# Patient Record
Sex: Female | Born: 1955
Health system: Southern US, Community
[De-identification: ages and names within clinical notes are randomized; demographics above are authoritative.]

## PROBLEM LIST (undated history)

## (undated) DIAGNOSIS — J45909 Unspecified asthma, uncomplicated: Secondary | ICD-10-CM

## (undated) HISTORY — PX: ABDOMINAL HYSTERECTOMY: SHX81

---

## 2015-07-13 ENCOUNTER — Ambulatory Visit
Admission: EM | Admit: 2015-07-13 | Discharge: 2015-07-13 | Disposition: A | Discharge status: Home / Self Care | Payer: BLUE CROSS/BLUE SHIELD | Attending: Family Medicine | Admitting: Family Medicine

## 2015-07-13 ENCOUNTER — Ambulatory Visit (INDEPENDENT_AMBULATORY_CARE_PROVIDER_SITE_OTHER): Payer: BLUE CROSS/BLUE SHIELD

## 2015-07-13 DIAGNOSIS — J45901 Unspecified asthma with (acute) exacerbation: Secondary | ICD-10-CM | POA: Diagnosis not present

## 2015-07-13 DIAGNOSIS — R0602 Shortness of breath: Secondary | ICD-10-CM | POA: Diagnosis not present

## 2015-07-13 HISTORY — DX: Unspecified asthma, uncomplicated: J45.909

## 2015-07-13 MED ORDER — HYDROCOD POLST-CPM POLST ER 10-8 MG/5ML PO SUER: 5.0000 mL | Freq: Two times a day (BID) | ORAL | Status: DC | PRN

## 2015-07-13 MED ORDER — FLUTICASONE-SALMETEROL 250-50 MCG/DOSE IN AEPB: 1.0000 | INHALATION_SPRAY | Freq: Two times a day (BID) | RESPIRATORY_TRACT | Status: DC

## 2015-07-13 MED ORDER — METHYLPREDNISOLONE SODIUM SUCC 125 MG IJ SOLR
125.0000 mg | Freq: Once | INTRAMUSCULAR | Status: AC
  Administered 2015-07-13: 125 mg via INTRAMUSCULAR

## 2015-07-13 MED ORDER — PREDNISONE 10 MG (21) PO TBPK: ORAL_TABLET | ORAL | Status: DC

## 2015-07-13 MED ORDER — ALBUTEROL SULFATE 108 (90 BASE) MCG/ACT IN AEPB: 2.0000 | INHALATION_SPRAY | RESPIRATORY_TRACT | Status: AC | PRN

## 2015-07-13 MED ORDER — IPRATROPIUM-ALBUTEROL 0.5-2.5 (3) MG/3ML IN SOLN
3.0000 mL | Freq: Once | RESPIRATORY_TRACT | Status: AC
  Administered 2015-07-13: 3 mL via RESPIRATORY_TRACT

## 2015-07-13 MED ORDER — AZITHROMYCIN 500 MG PO TABS: ORAL_TABLET | ORAL | Status: DC

## 2015-07-13 NOTE — ED Notes (Addendum)
States started last Thursday with cough that progressed to sinus congestion and now "moved back into chest and I''m worried about being SOB". States had CXR at a [facility] facility about 2-3 months ago

## 2015-07-13 NOTE — Discharge Instructions (Signed)
Asthma, Acute Bronchospasm °Acute bronchospasm caused by asthma is also referred to as an asthma attack. Bronchospasm means your air passages become narrowed. The narrowing is caused by inflammation and tightening of the muscles in the air tubes (bronchi) in your lungs. This can make it hard to breathe or cause you to wheeze and cough. °CAUSES °Possible triggers are: °· Animal dander from the skin, hair, or feathers of animals. °· Dust mites contained in house dust. °· Cockroaches. °· Pollen from trees or grass. °· Mold. °· Cigarette or tobacco smoke. °· Air pollutants such as dust, household cleaners, hair sprays, aerosol sprays, paint fumes, strong chemicals, or strong odors. °· Cold air or weather changes. Cold air may trigger inflammation. Winds increase molds and pollens in the air. °· Strong emotions such as crying or laughing hard. °· Stress. °· Certain medicines such as aspirin or beta-blockers. °· Sulfites in foods and drinks, such as dried fruits and wine. °· Infections or inflammatory conditions, such as a flu, cold, or inflammation of the nasal membranes (rhinitis). °· Gastroesophageal reflux disease (GERD). GERD is a condition where stomach acid backs up into your esophagus. °· Exercise or strenuous activity. °SIGNS AND SYMPTOMS  °· Wheezing. °· Excessive coughing, particularly at night. °· Chest tightness. °· Shortness of breath. °DIAGNOSIS  °Your health care provider will ask you about your medical history and perform a physical exam. A chest X-ray or blood testing may be performed to look for other causes of your symptoms or other conditions that may have triggered your asthma attack.  °TREATMENT  °Treatment is aimed at reducing inflammation and opening up the airways in your lungs.  Most asthma attacks are treated with inhaled medicines. These include quick relief or rescue medicines (such as bronchodilators) and controller medicines (such as inhaled corticosteroids). These medicines are sometimes  given through an inhaler or a nebulizer. Systemic steroid medicine taken by mouth or given through an IV tube also can be used to reduce the inflammation when an attack is moderate or severe. Antibiotic medicines are only used if a bacterial infection is present.  °HOME CARE INSTRUCTIONS  °· Rest. °· Drink plenty of liquids. This helps the mucus to remain thin and be easily coughed up. Only use caffeine in moderation and do not use alcohol until you have recovered from your illness. °· Do not smoke. Avoid being exposed to secondhand smoke. °· You play a critical role in keeping yourself in good health. Avoid exposure to things that cause you to wheeze or to have breathing problems. °· Keep your medicines up-to-date and available. Carefully follow your health care provider's treatment plan. °· Take your medicine exactly as prescribed. °· When pollen or pollution is bad, keep windows closed and use an air conditioner or go to places with air conditioning. °· Asthma requires careful medical care. See your health care provider for a follow-up as advised. If you are more than [redacted] weeks pregnant and you were prescribed any new medicines, let your obstetrician know about the visit and how you are doing. Follow up with your health care provider as directed. °· After you have recovered from your asthma attack, make an appointment with your outpatient doctor to talk about ways to reduce the likelihood of future attacks. If you do not have a doctor who manages your asthma, make an appointment with a primary care doctor to discuss your asthma. °SEEK IMMEDIATE MEDICAL CARE IF:  °· You are getting worse. °· You have trouble breathing. If severe, call your local   emergency services (911 in the [place]  You develop chest pain or discomfort.  You are vomiting.  You are not able to keep fluids down.  You are coughing up yellow, green, brown, or bloody sputum.  You have a fever and your symptoms suddenly get worse.  You have  trouble swallowing. MAKE SURE YOU:   Understand these instructions.  Will watch your condition.  Will get help right away if you are not doing well or get worse.   This information is not intended to replace advice given to you by your health care provider. Make sure you discuss any questions you have with your health care provider.   Document Released: 09/26/2006 Document Revised: 06/16/2013 Document Reviewed: 12/17/2012 Elsevier Interactive Patient Education 2016 Elsevier Inc.  Cough, Adult A cough helps to clear your throat and lungs. A cough may last only 2-3 weeks (acute), or it may last longer than 8 weeks (chronic). Many different things can cause a cough. A cough may be a sign of an illness or another medical condition. HOME CARE  Pay attention to any changes in your cough.  Take medicines only as told by your doctor.  If you were prescribed an antibiotic medicine, take it as told by your doctor. Do not stop taking it even if you start to feel better.  Talk with your doctor before you try using a cough medicine.  Drink enough fluid to keep your pee (urine) clear or pale yellow.  If the air is dry, use a cold steam vaporizer or humidifier in your home.  Stay away from things that make you cough at work or at home.  If your cough is worse at night, try using extra pillows to raise your head up higher while you sleep.  Do not smoke, and try not to be around smoke. If you need help quitting, ask your doctor.  Do not have caffeine.  Do not drink alcohol.  Rest as needed. GET HELP IF:  You have new problems (symptoms).  You cough up yellow fluid (pus).  Your cough does not get better after 2-3 weeks, or your cough gets worse.  Medicine does not help your cough and you are not sleeping well.  You have pain that gets worse or pain that is not helped with medicine.  You have a fever.  You are losing weight and you do not know why.  You have night sweats. GET  HELP RIGHT AWAY IF:  You cough up blood.  You have trouble breathing.  Your heartbeat is very fast.   This information is not intended to replace advice given to you by your health care provider. Make sure you discuss any questions you have with your health care provider.   Document Released: 02/22/2011 Document Revised: 03/02/2015 Document Reviewed: 08/18/2014 Elsevier Interactive Patient Education 2016 [employer].  How to Use an Inhaler Using your inhaler correctly is very important. Good technique will make sure that the medicine reaches your lungs.  HOW TO USE AN INHALER:  Take the cap off the inhaler.  If this is the first time using your inhaler, you need to prime it. Shake the inhaler for 5 seconds. Release four puffs into the air, away from your face. Ask your doctor for help if you have questions.  Shake the inhaler for 5 seconds.  Turn the inhaler so the bottle is above the mouthpiece.  Put your pointer finger on top of the bottle. Your thumb holds the bottom of the inhaler.  Open your mouth.  Either hold the inhaler away from your mouth (the width of 2 fingers) or place your lips tightly around the mouthpiece. Ask your doctor which way to use your inhaler.  Breathe out as much air as possible.  Breathe in and push down on the bottle 1 time to release the medicine. You will feel the medicine go in your mouth and throat.  Continue to take a deep breath in very slowly. Try to fill your lungs.  After you have breathed in completely, hold your breath for 10 seconds. This will help the medicine to settle in your lungs. If you cannot hold your breath for 10 seconds, hold it for as long as you can before you breathe out.  Breathe out slowly, through pursed lips. Whistling is an example of pursed lips.  If your doctor has told you to take more than 1 puff, wait at least 15-30 seconds between puffs. This will help you get the best results from your medicine. Do not use  the inhaler more than your doctor tells you to.  Put the cap back on the inhaler.  Follow the directions from your doctor or from the inhaler package about cleaning the inhaler. If you use more than one inhaler, ask your doctor which inhalers to use and what order to use them in. Ask your doctor to help you figure out when you will need to refill your inhaler.  If you use a steroid inhaler, always rinse your mouth with water after your last puff, gargle and spit out the water. Do not swallow the water. GET HELP IF:  The inhaler medicine only partially helps to stop wheezing or shortness of breath.  You are having trouble using your inhaler.  You have some increase in thick spit (phlegm). GET HELP RIGHT AWAY IF:  The inhaler medicine does not help your wheezing or shortness of breath or you have tightness in your chest.  You have dizziness, headaches, or fast heart rate.  You have chills, fever, or night sweats.  You have a large increase of thick spit, or your thick spit is bloody. MAKE SURE YOU:   Understand these instructions.  Will watch your condition.  Will get help right away if you are not doing well or get worse.   This information is not intended to replace advice given to you by your health care provider. Make sure you discuss any questions you have with your health care provider.   Document Released: 03/20/2008 Document Revised: 04/01/2013 Document Reviewed: 01/08/2013 Elsevier Interactive Patient Education 2016 [employer].  Shortness of Breath Shortness of breath means you have trouble breathing. Shortness of breath needs medical care right away. HOME CARE   Do not smoke.  Avoid being around chemicals or things (paint fumes, dust) that may bother your breathing.  Rest as needed. Slowly begin your normal activities.  Only take medicines as told by your doctor.  Keep all doctor visits as told. GET HELP RIGHT AWAY IF:   Your shortness of breath gets  worse.  You feel lightheaded, pass out (faint), or have a cough that is not helped by medicine.  You cough up blood.  You have pain with breathing.  You have pain in your chest, arms, shoulders, or belly (abdomen).  You have a fever.  You cannot walk up stairs or exercise the way you normally do.  You do not get better in the time expected.  You have a hard time doing normal activities  even with rest.  You have problems with your medicines.  You have any new symptoms. MAKE SURE YOU:  Understand these instructions.  Will watch your condition.  Will get help right away if you are not doing well or get worse.   This information is not intended to replace advice given to you by your health care provider. Make sure you discuss any questions you have with your health care provider.   Document Released: 11/28/2007 Document Revised: 06/16/2013 Document Reviewed: 08/27/2011 Elsevier Interactive Patient Education [employer]

## 2015-07-13 NOTE — ED Provider Notes (Signed)
CSN: [id number]     Arrival date & time 07/13/15  2130 History   First MD Initiated Contact with Patient 07/13/15 [phone]    Nurses notes were reviewed. Chief Complaint  Patient presents with  . Cough  . Shortness of Breath   She is here because of shortness of breath. She was diagnosed last year with adult onset asthma is also correlated last year with her stopping smoking in May 2015. She reports she was given a steroid inhaler which didn't do much for her. About 4 days ago she started coughing initially productive but increased shortness of breath as well. This is the first time she has really had a bad experience and she states that she can take just a few steps and she starts skiing short of breath with exertion soft. She reports coughing wheezing and difficulty sleeping at night as well because the cough.  He has a uncle on mother's side died of lung cancer mother is very healthy and father died of heart disease. States she stopped smoking 2015 she denies any other medical problems.   (Consider location/radiation/quality/duration/timing/severity/associated sxs/prior Treatment) Patient is a 60 y.o. female presenting with shortness of breath. The history is provided by the patient. No language interpreter was used.  Shortness of Breath Severity:  Severe Duration:  4 days Timing:  Constant Progression:  Worsening Chronicity:  New Context: URI and weather changes   Relieved by:  Nothing Ineffective treatments:  Inhaler Associated symptoms: cough   Associated symptoms: no chest pain   Risk factors: tobacco use     Past Medical History  Diagnosis Date  . Asthma    Past Surgical History  Procedure Laterality Date  . Abdominal hysterectomy     History reviewed. No pertinent family history. Social History  Substance Use Topics  . Smoking status: Former Games developer  . Smokeless tobacco: None  . Alcohol Use: No   OB History    No data available     Review of Systems  Respiratory:  Positive for cough and shortness of breath.   Cardiovascular: Negative for chest pain.  All other systems reviewed and are negative.   Allergies  Review of patient's allergies indicates no known allergies.  Home Medications   Prior to Admission medications   Medication Sig Start Date End Date Taking? Authorizing Provider  fluticasone (FLOVENT HFA) 110 MCG/ACT inhaler Inhale into the lungs 2 (two) times daily.   Yes Historical Provider, MD  Albuterol Sulfate (PROAIR RESPICLICK) 108 (90 Base) MCG/ACT AEPB Inhale 2 puffs into the lungs as needed (Q2 hours when necessary for shortness of breath). 07/13/15   Hassan Rowan, MD  azithromycin (ZITHROMAX) 500 MG tablet 1 tablet day for 5 days 07/13/15   Hassan Rowan, MD  chlorpheniramine-HYDROcodone [facility]) 10-8 MG/5ML SUER Take 5 mLs by mouth every 12 (twelve) hours as needed for cough. 07/13/15   Hassan Rowan, MD  Fluticasone-Salmeterol (ADVAIR DISKUS) 250-50 MCG/DOSE AEPB Inhale 1 puff into the lungs 2 (two) times daily. 07/13/15   Hassan Rowan, MD  predniSONE (STERAPRED UNI-PAK 21 TAB) 10 MG (21) TBPK tablet Sig 6 tablet day 1, 5 tablets day 2, 4 tablets day 3,,3tablets day 4, 2 tablets day 5, 1 tablet day 6 take all tablets orally 07/13/15   Hassan Rowan, MD   Meds Ordered and Administered this Visit   Medications  methylPREDNISolone sodium succinate (SOLU-MEDROL) 125 mg/2 mL injection 125 mg (125 mg Intramuscular Given 07/13/15 0953)  ipratropium-albuterol (DUONEB) 0.5-2.5 (3) MG/3ML nebulizer solution  3 mL (3 mLs Nebulization Given 07/13/15 0953)    BP 152/87 mmHg  Pulse 96  Temp(Src) 97.8 F (36.6 C) (Tympanic)  Resp 22  Ht  (1.702 m)  Wt 165 lb (74.844 kg)  BMI 25.84 kg/m2  SpO2 94% No data found.   Physical Exam  Constitutional: She is oriented to person, place, and time. She appears well-developed and well-nourished.  HENT:  Head: Normocephalic and atraumatic.  Eyes: Conjunctivae are normal. Pupils are equal,  round, and reactive to light.  Neck: Normal range of motion. Neck supple.  Cardiovascular: Normal rate, regular rhythm and normal heart sounds.   Pulmonary/Chest: Effort normal. No respiratory distress. She has wheezes. She has no rales.  Musculoskeletal: Normal range of motion. She exhibits no edema.  Lymphadenopathy:    She has no cervical adenopathy.  Neurological: She is alert and oriented to person, place, and time. No cranial nerve deficit.  Skin: Skin is warm and dry.  Psychiatric: She has a normal mood and affect.  Vitals reviewed.   ED Course  Procedures (including critical care time)  Labs Review Labs Reviewed - No data to display  Imaging Review Dg Chest 2 View  07/13/2015  CLINICAL DATA:  Six day history of shortness of breath EXAM: CHEST  2 VIEW COMPARISON:  None. FINDINGS: Lungs are mildly hyperexpanded. No edema or consolidation. Heart size and pulmonary vascularity are normal. No adenopathy. No bone lesions. IMPRESSION: Lungs mildly hyperexpanded without edema or consolidation. Electronically Signed   By: Bretta Bang III M.D.   On: 07/13/2015 09:36     Visual Acuity Review  Right Eye Distance:   Left Eye Distance:   Bilateral Distance:    Right Eye Near:   Left Eye Near:    Bilateral Near:         MDM   1. SOB (shortness of breath) on exertion   2. Acute asthma exacerbation, unspecified asthma severity    Patient was given under 25 mg sorry Medrol IM DuoNeb aerosol treatment here with improvement of her O2 saturation. Patient was in tears as discussed my concern that she has COPD. Lungs suggest you get back with her PCP. She states that no clear-cut diagnoses been made since she was placed on Flovent inhaler she never did go back to see her doctor over a year ago. Explained to her that she needs excision she wants to be followed by her current doctor or see someone else she needs to have probably a spirometry done so that they can diagnose her if she  does does not have COPD which I think she does. Explained to her that think this is acute exacerbation of COPD. With that were going to place on Tussionex 1 teaspoon twice a day Zithromax for 5 days and I'll place on Advair discus inhaler if her insurance covers it in a [facility] inhaler and 6 day course of prednisone. Chest x-ray was negative and a pulse explained patient that cannot say for sure that she does have COPD until further testing as been done.    Hassan Rowan, MD 07/13/15 1012

## 2016-12-29 IMAGING — CR DG CHEST 2V
2 series · 2 of 2 positions shown · non-contrast
Comparison: None.

CLINICAL DATA: Six day history of shortness of breath

EXAM:
CHEST  2 VIEW

[chest pa]
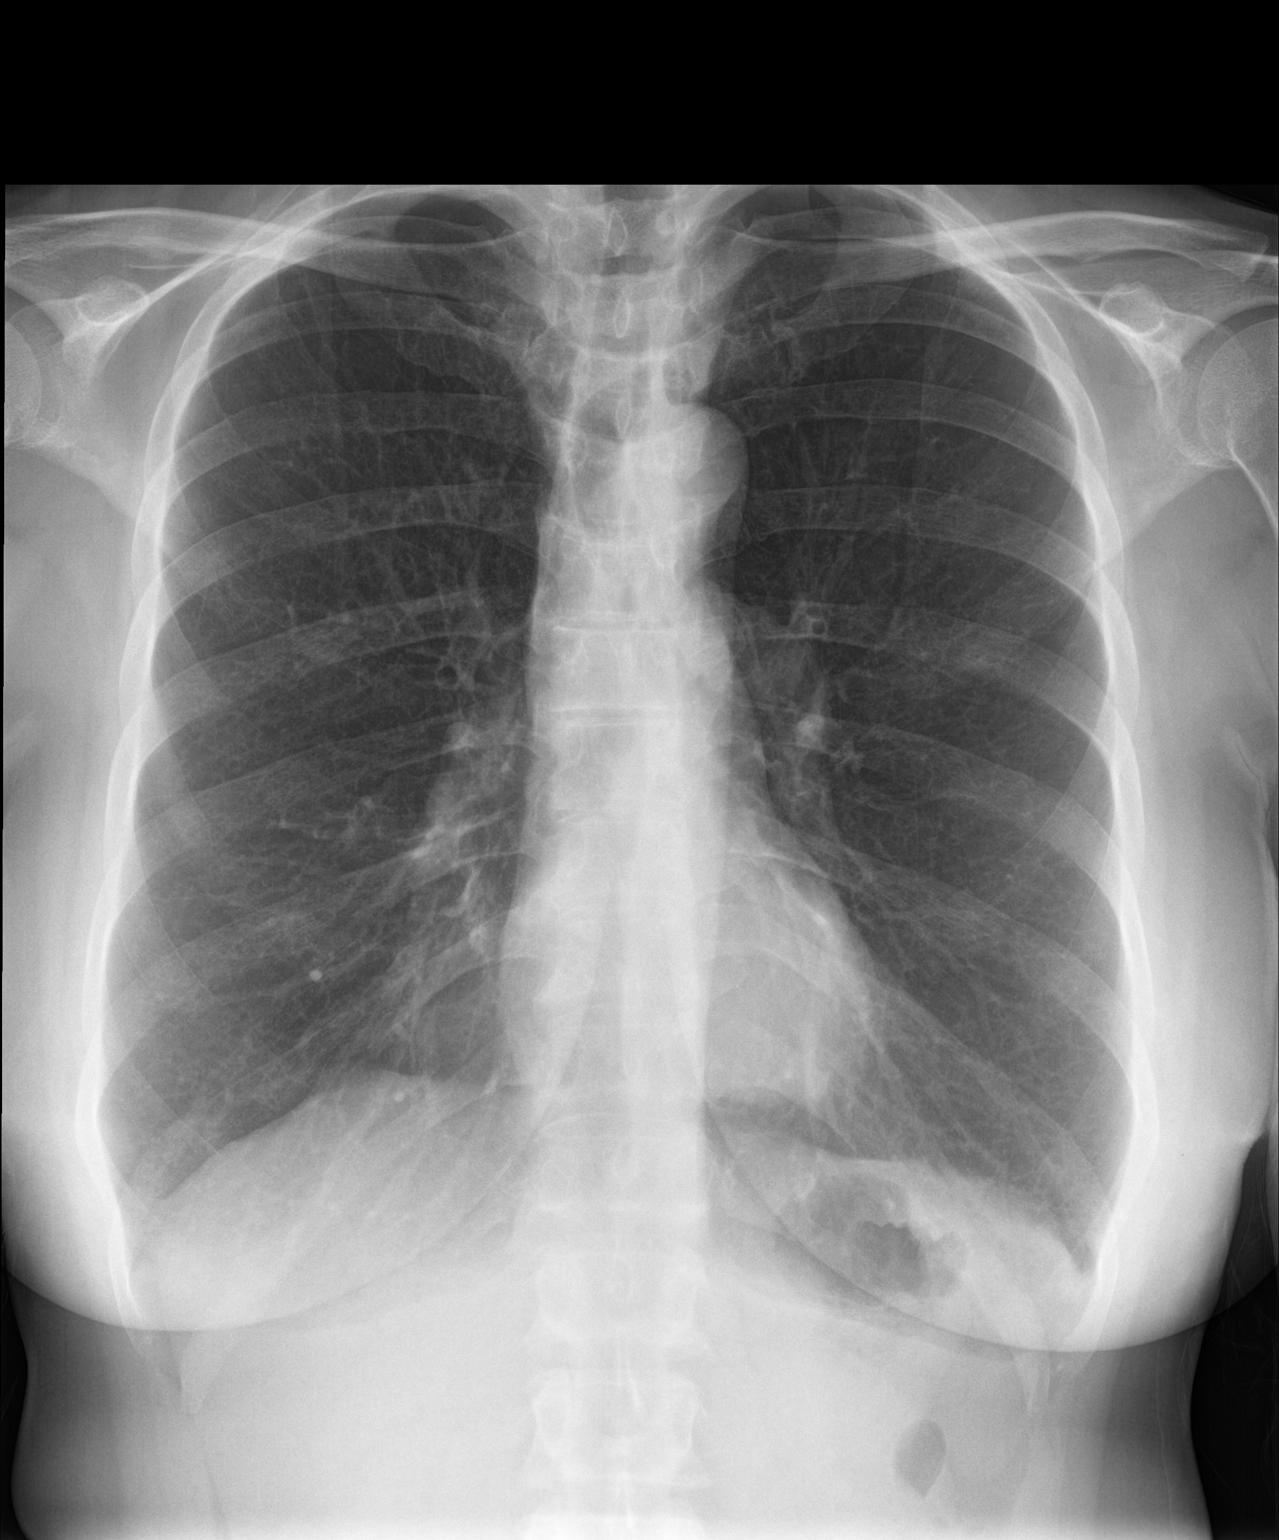

[chest lat]
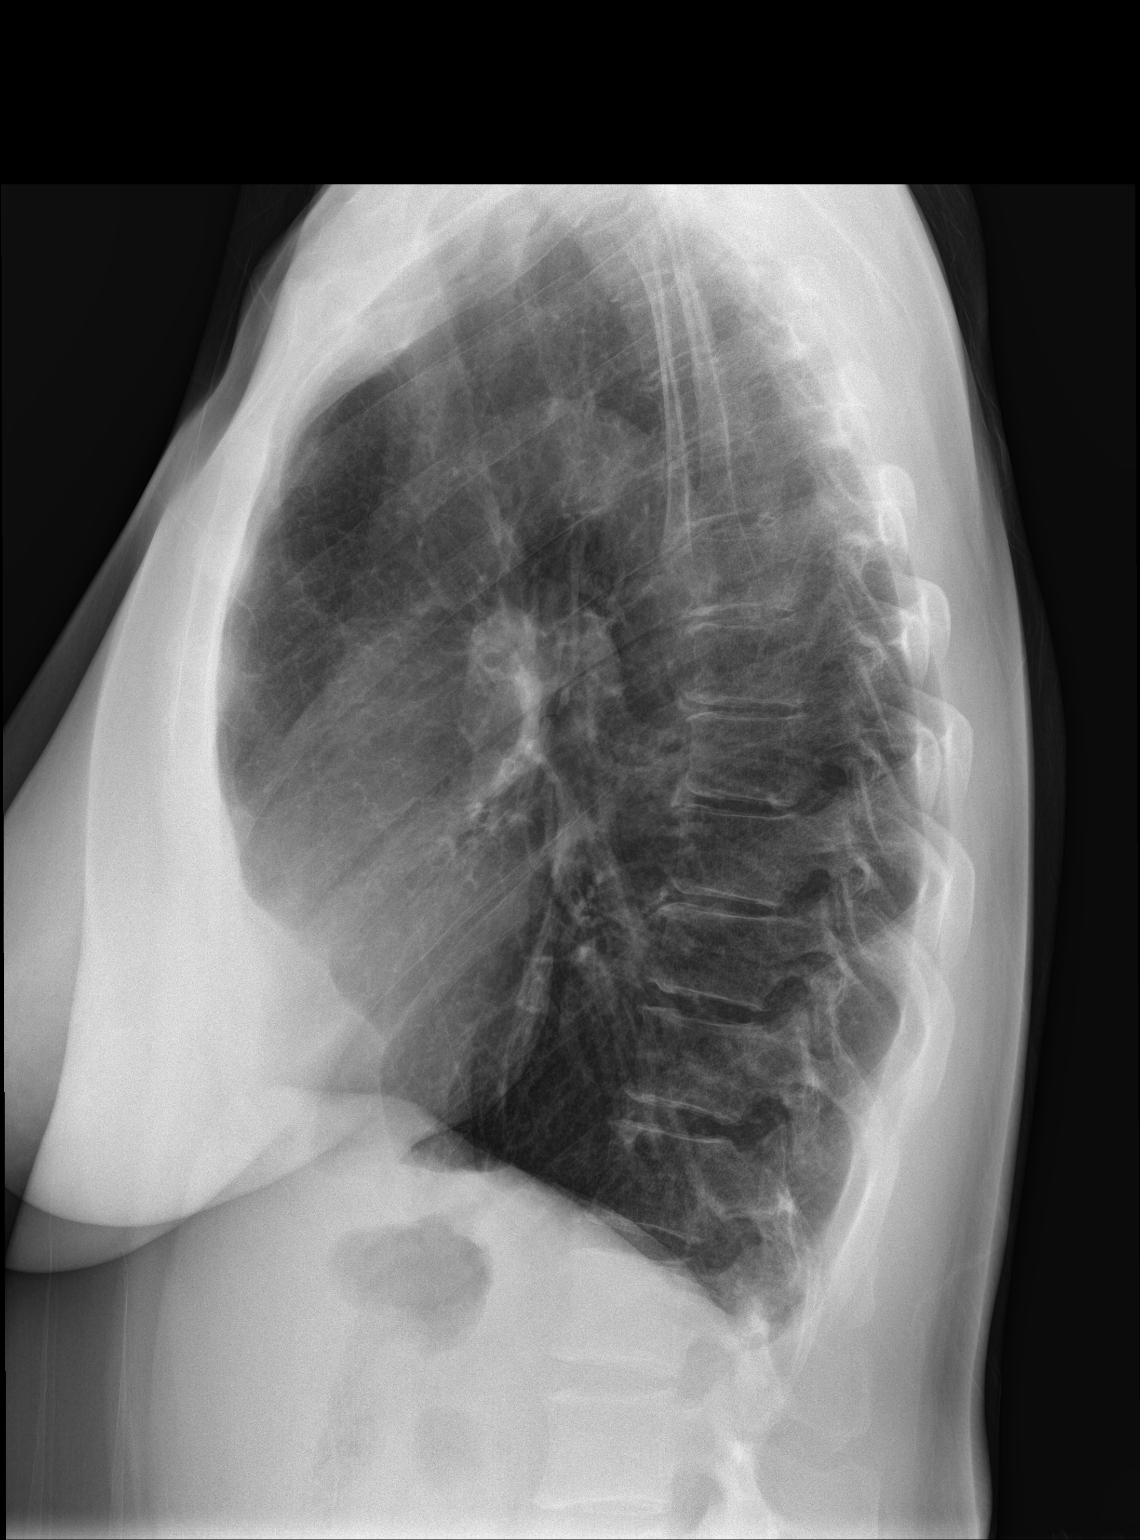

[2 of 2 positions shown; findings below may reference images not displayed]

FINDINGS: Lungs are mildly hyperexpanded. No edema or consolidation. Heart
size and pulmonary vascularity are normal. No adenopathy. No bone
lesions.
IMPRESSION: Lungs mildly hyperexpanded without edema or consolidation.

## 2017-02-26 ENCOUNTER — Encounter: Payer: Self-pay | Admitting: Emergency Medicine

## 2017-02-26 ENCOUNTER — Ambulatory Visit
Admission: EM | Admit: 2017-02-26 | Discharge: 2017-02-26 | Disposition: A | Discharge status: Home / Self Care | Payer: BLUE CROSS/BLUE SHIELD | Attending: Family Medicine | Admitting: Family Medicine

## 2017-02-26 DIAGNOSIS — R21 Rash and other nonspecific skin eruption: Secondary | ICD-10-CM | POA: Diagnosis not present

## 2017-02-26 DIAGNOSIS — L247 Irritant contact dermatitis due to plants, except food: Secondary | ICD-10-CM

## 2017-02-26 MED ORDER — PREDNISONE 10 MG (21) PO TBPK: ORAL_TABLET | ORAL | 0 refills | Status: AC

## 2017-02-26 NOTE — ED Triage Notes (Signed)
Patient states that she had a shingles vaccine last Thursday.  Patient reports that she has red itchy bumps on her face and arms that started last night.  Patient denies SOB.  Patient reports no difficulty swallowing or tongue swelling.

## 2017-02-26 NOTE — ED Provider Notes (Signed)
[facility]    CSN: [id number] Arrival date & time: 02/26/17  1433     History   Chief Complaint Chief Complaint  Patient presents with  . Rash    HPI Gail Richard is a 61 y.o. female.   Patient is a [age]-year-old black female who developed a rash. Last week she got shingles vaccination she was worried the rash could be coming from the shingles vaccination however over the weekend she was mowing admits that she used to tablet and she had blood gloves on but had gloves on which she uses Tylenol or white face and she does not have a history of allergies that she is aware of. She does have history of asthma and COPD. She is a former smoker. His had abdominal hysterectomy in the past. No pertinent family medical history relevant to today's visit   The history is provided by the patient. No language interpreter was used.  Rash  Location:  Face and shoulder/arm Facial rash location:  Face, L cheek, R cheek, nose, upper lip, lower lip, L eyelid, R eyelid and forehead Shoulder/arm rash location:  L forearm and R forearm Quality: blistering, draining, itchiness, redness and scaling   Onset quality:  Sudden Duration:  2 days Timing:  Constant Progression:  Worsening Chronicity:  New Context: plant contact   Relieved by:  Nothing Ineffective treatments:  None tried   Past Medical History:  Diagnosis Date  . Asthma     There are no active problems to display for this patient.   Past Surgical History:  Procedure Laterality Date  . ABDOMINAL HYSTERECTOMY      OB History    No data available       Home Medications    Prior to Admission medications   Medication Sig Start Date End Date Taking? Authorizing Provider  umeclidinium-vilanterol (ANORO ELLIPTA) 62.5-25 MCG/INH AEPB Inhale 1 puff into the lungs daily.   Yes [provider]  Albuterol Sulfate (PROAIR RESPICLICK) 108 (90 Base) MCG/ACT AEPB Inhale 2 puffs into the lungs as needed (Q2 hours when  necessary for shortness of breath). 07/13/15   Hassan Rowan, MD  predniSONE (STERAPRED UNI-PAK 21 TAB) 10 MG (21) TBPK tablet 6 tabs day 1 and 2, 5 tabs day 3 and 4, 4 tabs day 5 and 6, 3 tabs day 7 and 8, 2 tabs day 9 and 10, 1 tab day 11 and 12. Take orally 02/26/17   Hassan Rowan, MD    Family History History reviewed. No pertinent family history.  Social History Social History  Substance Use Topics  . Smoking status: Former Games developer  . Smokeless tobacco: Never Used  . Alcohol use No     Allergies   Patient has no known allergies.   Review of Systems Review of Systems  Skin: Positive for rash.  All other systems reviewed and are negative.    Physical Exam Triage Vital Signs ED Triage Vitals  Enc Vitals Group     BP 02/26/17 1503 (!) 142/81     Pulse Rate 02/26/17 1503 86     Resp 02/26/17 1503 16     Temp 02/26/17 1503 98.4 F (36.9 C)     Temp Source 02/26/17 1503 Oral     SpO2 02/26/17 1503 98 %     Weight 02/26/17 1501 175 lb (79.4 kg)     Height 02/26/17 1501 5\' 7"  (1.702 m)     Head Circumference --      Peak Flow --  Pain Score 02/26/17 1501 0     Pain Loc --      Pain Edu? --      Excl. in GC? --    No data found.   Updated Vital Signs BP (!) 142/81 (BP Location: Left Arm)   Pulse 86   Temp 98.4 F (36.9 C) (Oral)   Resp 16   Ht 5\' 7"  (1.702 m)   Wt 175 lb (79.4 kg)   SpO2 98%   BMI 27.41 kg/m   Visual Acuity Right Eye Distance:   Left Eye Distance:   Bilateral Distance:    Right Eye Near:   Left Eye Near:    Bilateral Near:     Physical Exam  Constitutional: She is oriented to person, place, and time. She appears well-developed and well-nourished.  HENT:  Head: Normocephalic.  Right Ear: External ear normal.  Left Ear: External ear normal.  Mouth/Throat: Oropharynx is clear and moist.  Eyes: Pupils are equal, round, and reactive to light. Conjunctivae and EOM are normal.  Neck: Normal range of motion.  Pulmonary/Chest: Effort  normal.  Musculoskeletal: Normal range of motion.  Neurological: She is alert and oriented to person, place, and time.  Skin: Rash noted. There is erythema.     Rash is consistent with contact dermatitis with the peeling and blister formation  Psychiatric: She has a normal mood and affect.  Vitals reviewed.    UC Treatments / Results  Labs (all labs ordered are listed, but only abnormal results are displayed) Labs Reviewed - No data to display  EKG  EKG Interpretation None       Radiology No results found.  Procedures Procedures (including critical care time)  Medications Ordered in UC Medications - No data to display   Initial Impression / Assessment and Plan / UC Course  I have reviewed the triage vital signs and the nursing notes.  Pertinent labs & imaging results that were available during my care of the patient were reviewed by me and considered in my medical decision making (see chart for details).   reassure patient was appears to be a contact dermatitis poison oak or poison ivy. We'll place her on a 12 day course of prednisone offer injection of Solu-Medrol she declined work note for today and tomorrow as well. Follow-up with PCP in 2 weeks not better and stressed the need to take the presence of 12 days.  Note: This dictation was prepared with Dragon dictation along with smaller phrase technology. Any transcriptional errors that result from this process are unintentional.  Final Clinical Impressions(s) / UC Diagnoses   Final diagnoses:  Irritant contact dermatitis due to plants, except food    New Prescriptions Discharge Medication List as of 02/26/2017  4:22 PM    START taking these medications   Details  predniSONE (STERAPRED UNI-PAK 21 TAB) 10 MG (21) TBPK tablet 6 tabs day 1 and 2, 5 tabs day 3 and 4, 4 tabs day 5 and 6, 3 tabs day 7 and 8, 2 tabs day 9 and 10, 1 tab day 11 and 12. Take orally, Normal       Note: This dictation was prepared with  Dragon dictation along with smaller phrase technology. Any transcriptional errors that result from this process are unintentional.  Controlled Substance Prescriptions [place] Controlled Substance Registry consulted?Marland Kitchen. Not Applicable   Hassan RowanWade, Lima Chillemi, MD 02/26/17 [phone]

## 2021-09-30 ENCOUNTER — Ambulatory Visit: Payer: Self-pay
